# Patient Record
Sex: Female | Born: 1990 | Race: Black or African American | Hispanic: No | Marital: Single | Smoking: Current every day smoker
Health system: Southern US, Community
[De-identification: ages and names within clinical notes are randomized; demographics above are authoritative.]

## PROBLEM LIST (undated history)

## (undated) DIAGNOSIS — R002 Palpitations: Secondary | ICD-10-CM

---
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
## 2008-03-17 ENCOUNTER — Emergency Department: Payer: Self-pay | Admitting: Emergency Medicine

## 2008-07-29 ENCOUNTER — Emergency Department: Payer: Self-pay | Admitting: Emergency Medicine

## 2009-03-16 ENCOUNTER — Ambulatory Visit: Payer: Self-pay | Admitting: Family Medicine

## 2009-05-30 ENCOUNTER — Emergency Department: Payer: Self-pay | Admitting: Unknown Physician Specialty

## 2010-05-15 ENCOUNTER — Emergency Department: Payer: Self-pay | Admitting: Emergency Medicine

## 2011-05-23 ENCOUNTER — Emergency Department (HOSPITAL_COMMUNITY)
Admission: EM | Admit: 2011-05-23 | Discharge: 2011-05-23 | Disposition: A | Discharge status: Home / Self Care | Payer: Self-pay | Attending: Emergency Medicine | Admitting: Emergency Medicine

## 2011-05-23 ENCOUNTER — Emergency Department (HOSPITAL_COMMUNITY): Payer: Self-pay

## 2011-05-23 DIAGNOSIS — F17200 Nicotine dependence, unspecified, uncomplicated: Secondary | ICD-10-CM | POA: Insufficient documentation

## 2011-05-23 DIAGNOSIS — R0789 Other chest pain: Secondary | ICD-10-CM | POA: Insufficient documentation

## 2011-05-23 DIAGNOSIS — M940 Chondrocostal junction syndrome [Tietze]: Secondary | ICD-10-CM | POA: Insufficient documentation

## 2011-05-23 DIAGNOSIS — R55 Syncope and collapse: Secondary | ICD-10-CM | POA: Insufficient documentation

## 2011-05-23 DIAGNOSIS — E538 Deficiency of other specified B group vitamins: Secondary | ICD-10-CM | POA: Insufficient documentation

## 2011-05-23 LAB — BASIC METABOLIC PANEL
BUN: 7 mg/dL (ref 6–23)
Chloride: 104 mEq/L (ref 96–112)
Creatinine, Ser: 0.6 mg/dL (ref 0.50–1.10)
GFR calc Af Amer: >60 mL/min (ref >60)

## 2011-05-23 LAB — POCT PREGNANCY, URINE: Preg Test, Ur: NEGATIVE

## 2011-05-23 LAB — CBC
MCH: 26.4 pg (ref 26.0–34.0)
Platelets: 265 10*3/uL (ref 150–400)
RBC: 4.81 MIL/uL (ref 3.87–5.11)
WBC: 5.8 10*3/uL (ref 4.0–10.5)

## 2011-05-23 LAB — URINE MICROSCOPIC-ADD ON

## 2011-05-23 LAB — POCT I-STAT TROPONIN I: Troponin i, poc: 0 ng/mL (ref 0.00–0.08)

## 2011-05-23 LAB — URINALYSIS, ROUTINE W REFLEX MICROSCOPIC
Ketones, ur: NEGATIVE mg/dL
Nitrite: NEGATIVE
Specific Gravity, Urine: 1.027 (ref 1.005–1.030)
pH: 6.5 (ref 5.0–8.0)

## 2011-05-23 LAB — DIFFERENTIAL
Basophils Absolute: 0 10*3/uL (ref 0.0–0.1)
Basophils Relative: 1 % (ref 0–1)
Eosinophils Absolute: 0.1 10*3/uL (ref 0.0–0.7)
Neutrophils Relative %: 64 % (ref 43–77)

## 2012-02-26 ENCOUNTER — Ambulatory Visit: Payer: Self-pay | Admitting: Family Medicine

## 2014-02-08 ENCOUNTER — Encounter (HOSPITAL_COMMUNITY): Payer: Self-pay | Admitting: Emergency Medicine

## 2014-02-08 ENCOUNTER — Emergency Department (INDEPENDENT_AMBULATORY_CARE_PROVIDER_SITE_OTHER): Payer: No Typology Code available for payment source

## 2014-02-08 ENCOUNTER — Emergency Department (HOSPITAL_COMMUNITY)
Admission: EM | Admit: 2014-02-08 | Discharge: 2014-02-08 | Disposition: A | Discharge status: Home / Self Care | Payer: No Typology Code available for payment source | Source: Home / Self Care

## 2014-02-08 DIAGNOSIS — K219 Gastro-esophageal reflux disease without esophagitis: Principal | ICD-10-CM

## 2014-02-08 DIAGNOSIS — IMO0001 Reserved for inherently not codable concepts without codable children: Secondary | ICD-10-CM

## 2014-02-08 HISTORY — DX: Palpitations: R00.2

## 2014-02-08 MED ORDER — PANTOPRAZOLE SODIUM 40 MG PO TBEC: 40.0000 mg | DELAYED_RELEASE_TABLET | Freq: Every day | ORAL | Status: AC

## 2014-02-08 NOTE — Discharge Instructions (Signed)
Your symptoms are likely realated to heart burn. This will improve with treatment with an acid reducer pill There is no evidence of this being related to your heart or lungs.  Please avoid triggers, and stressfull situations, caffeine, and chocolate.  Please come back if your symptoms get worse.

## 2014-02-08 NOTE — ED Notes (Signed)
Chest pain woke her up last night. Pain from epigastric area to throat.  Pain is constant.  C/o SOB at the same time.  States her R shoulder is numb.  No known injury.  No cold symptoms or allergy symptoms.  Has a little nausea and sweating.  No fever.

## 2014-02-08 NOTE — ED Provider Notes (Signed)
CSN: [id number]     Arrival date & time 02/08/14  1940 History   None    Chief Complaint  Patient presents with  . Chest Pain   (Consider location/radiation/quality/duration/timing/severity/associated sxs/prior Treatment) HPI  CP: started in the middle of the night. Woke pt up from sleep. Sharp in nature. Started in epigastric region and traveled up to neck. Sitting up improves pain. Worse w/ bending over and lying down flat. Pt only able to go a couple steps before coming SOB. Currently experiencing palpitations. No recent strenuous activity.     Past Medical History  Diagnosis Date  . Fluttering sensation of heart    History reviewed. No pertinent past surgical history. History reviewed. No pertinent family history. History  Substance Use Topics  . Smoking status: Current Every Day Smoker -- 0.25 packs/day    Types: Cigarettes  . Smokeless tobacco: Not on file  . Alcohol Use: Yes     Comment: occasional   OB History   Grav Para Term Preterm Abortions TAB SAB Ect Mult Living                 Review of Systems  Constitutional: Negative for fever.  Cardiovascular: Positive for chest pain.  All other systems reviewed and are negative.   Allergies  Review of patient's allergies indicates no known allergies.  Home Medications   Prior to Admission medications   Not on File   BP 123/79  Pulse 99  Temp(Src) 99.5 F (37.5 C) (Oral)  Resp 24  SpO2 100%  LMP 01/30/2014 Physical Exam  Constitutional: She is oriented to person, place, and time. She appears well-developed and well-nourished. No distress.  HENT:  Head: Normocephalic and atraumatic.  Eyes: EOM are normal. Pupils are equal, round, and reactive to light.  Neck: Normal range of motion. No thyromegaly present.  Cardiovascular: Normal rate, regular rhythm, normal heart sounds and intact distal pulses.  Exam reveals no gallop and no friction rub.   No murmur heard. Pulmonary/Chest: Effort normal and breath  sounds normal. No respiratory distress. She has no wheezes. She has no rales. She exhibits no tenderness.  Abdominal: Soft. Bowel sounds are normal.  Musculoskeletal: Normal range of motion.  Mild reproducible chest pain on palpation  Neurological: She is alert and oriented to person, place, and time.  Skin: Skin is warm and dry. She is not diaphoretic.  Psychiatric: She has a normal mood and affect. Her behavior is normal. Judgment and thought content normal.    ED Course  Procedures (including critical care time) Labs Review Labs Reviewed - No data to display  Imaging Review Dg Chest 2 View  02/08/2014   CLINICAL DATA:  Chest pain and shortness of breath  EXAM: CHEST  2 VIEW  COMPARISON:  May 23, 2011  FINDINGS: The heart size and mediastinal contours are within normal limits. Both lungs are clear. The visualized skeletal structures are unremarkable.  IMPRESSION: No active cardiopulmonary disease.   Electronically Signed   By: Sherian ReinWei-Chen  Lin M.D.   On: 02/08/2014 20:43     MDM   1. Reflux    22yo w/ CP likely secondary to reflux and possibly also costochondritis. No evidence that this is cardiac or pulmonary in nature. Also likely w/ a strong anxiety component.  - Protonix - tylenol - precautions givena nd all questions answered    Ozella Rocksavid J Merrell, MD 02/08/14 2120

## 2014-02-09 NOTE — ED Provider Notes (Signed)
Medical screening examination/treatment/procedure(s) were performed by a resident physician or non-physician practitioner and as the supervising physician I was immediately available for consultation/collaboration.  Winnell Bento, MD    Kahmya Pinkham S Ashaad Gaertner, MD 02/09/14 0752 

## 2014-08-13 ENCOUNTER — Encounter (HOSPITAL_COMMUNITY): Payer: Self-pay | Admitting: Emergency Medicine

## 2014-08-13 ENCOUNTER — Emergency Department (HOSPITAL_COMMUNITY)
Admission: EM | Admit: 2014-08-13 | Discharge: 2014-08-13 | Disposition: A | Discharge status: Home / Self Care | Payer: No Typology Code available for payment source | Attending: Emergency Medicine | Admitting: Emergency Medicine

## 2014-08-13 ENCOUNTER — Emergency Department (HOSPITAL_COMMUNITY): Payer: No Typology Code available for payment source

## 2014-08-13 DIAGNOSIS — X58XXXA Exposure to other specified factors, initial encounter: Secondary | ICD-10-CM | POA: Insufficient documentation

## 2014-08-13 DIAGNOSIS — Z720 Tobacco use: Secondary | ICD-10-CM | POA: Insufficient documentation

## 2014-08-13 DIAGNOSIS — Z79899 Other long term (current) drug therapy: Secondary | ICD-10-CM | POA: Insufficient documentation

## 2014-08-13 DIAGNOSIS — S46912A Strain of unspecified muscle, fascia and tendon at shoulder and upper arm level, left arm, initial encounter: Secondary | ICD-10-CM | POA: Insufficient documentation

## 2014-08-13 DIAGNOSIS — Y9289 Other specified places as the place of occurrence of the external cause: Secondary | ICD-10-CM | POA: Insufficient documentation

## 2014-08-13 DIAGNOSIS — Y998 Other external cause status: Secondary | ICD-10-CM | POA: Insufficient documentation

## 2014-08-13 DIAGNOSIS — Y9389 Activity, other specified: Secondary | ICD-10-CM | POA: Insufficient documentation

## 2014-08-13 MED ORDER — METHOCARBAMOL 500 MG PO TABS: 500.0000 mg | ORAL_TABLET | Freq: Two times a day (BID) | ORAL | Status: AC

## 2014-08-13 MED ORDER — MELOXICAM 7.5 MG PO TABS: 15.0000 mg | ORAL_TABLET | Freq: Every day | ORAL | Status: AC

## 2014-08-13 MED ORDER — METHOCARBAMOL 500 MG PO TABS
500.0000 mg | ORAL_TABLET | Freq: Once | ORAL | Status: AC
  Administered 2014-08-13: 500 mg via ORAL
  Filled 2014-08-13: qty 1

## 2014-08-13 MED ORDER — OXYCODONE-ACETAMINOPHEN 5-325 MG PO TABS
2.0000 | ORAL_TABLET | Freq: Once | ORAL | Status: AC
  Administered 2014-08-13: 2 via ORAL
  Filled 2014-08-13: qty 2

## 2014-08-13 NOTE — Discharge Instructions (Signed)
Alternate ice and heat to your shoulder 3-4 times per day. Take Mobic and Robaxin as prescribed. Stretch your shoulder daily. Avoid heavy lifting x 1 week. Follow up with a primary care doctor.  Shoulder Sprain A shoulder sprain is the result of damage to the tough, fiber-like tissues (ligaments) that help hold your shoulder in place. The ligaments may be stretched or torn. Besides the main shoulder joint (the ball and socket), there are several smaller joints that connect the bones in this area. A sprain usually involves one of those joints. Most often it is the acromioclavicular (or AC) joint. That is the joint that connects the collarbone (clavicle) and the shoulder blade (scapula) at the top point of the shoulder blade (acromion). A shoulder sprain is a mild form of what is called a shoulder separation. Recovering from a shoulder sprain may take some time. For some, pain lingers for several months. Most people recover without long term problems. CAUSES   A shoulder sprain is usually caused by some kind of trauma. This might be:  Falling on an outstretched arm.  Being hit hard on the shoulder.  Twisting the arm.  Shoulder sprains are more likely to occur in people who:  Play sports.  Have balance or coordination problems. SYMPTOMS   Pain when you move your shoulder.  Limited ability to move the shoulder.  Swelling and tenderness on top of the shoulder.  Redness or warmth in the shoulder.  Bruising.  A change in the shape of the shoulder. DIAGNOSIS  Your healthcare provider may:  Ask about your symptoms.  Ask about recent activity that might have caused those symptoms.  Examine your shoulder. You may be asked to do simple exercises to test movement. The other shoulder will be examined for comparison.  Order some tests that provide a look inside the body. They can show the extent of the injury. The tests could include:  X-rays.  CT (computed tomography) scan.  MRI  (magnetic resonance imaging) scan. RISKS AND COMPLICATIONS  Loss of full shoulder motion.  Ongoing shoulder pain. TREATMENT  How long it takes to recover from a shoulder sprain depends on how severe it was. Treatment options may include:  Rest. You should not use the arm or shoulder until it heals.  Ice. For 2 or 3 days after the injury, put an ice pack on the shoulder up to 4 times a day. It should stay on for 15 to 20 minutes each time. Wrap the ice in a towel so it does not touch your skin.  Over-the-counter medicine to relieve pain.  A sling or brace. This will keep the arm still while the shoulder is healing.  Physical therapy or rehabilitation exercises. These will help you regain strength and motion. Ask your healthcare provider when it is OK to begin these exercises.  Surgery. The need for surgery is rare with a sprained shoulder, but some people may need surgery to keep the joint in place and reduce pain. HOME CARE INSTRUCTIONS   Ask your healthcare provider about what you should and should not do while your shoulder heals.  Make sure you know how to apply ice to the correct area of your shoulder.  Talk with your healthcare provider about which medications should be used for pain and swelling.  If rehabilitation therapy will be needed, ask your healthcare provider to refer you to a therapist. If it is not recommended, then ask about at-home exercises. Find out when exercise should begin. SEEK MEDICAL  CARE IF:  Your pain, swelling, or redness at the joint increases. SEEK IMMEDIATE MEDICAL CARE IF:   You have a fever.  You cannot move your arm or shoulder. Document Released: 01/25/2009 Document Revised: 12/01/2011 Document Reviewed: 01/25/2009 [facility] Patient Information 2015 [place] Center[employer], [place]. This information is not intended to replace advice given to you by your health care provider. Make sure you discuss any questions you have with your health care provider. Muscle  Cramps and Spasms Muscle cramps and spasms occur when a muscle or muscles tighten and you have no control over this tightening (involuntary muscle contraction). They are a common problem and can develop in any muscle. The most common place is in the calf muscles of the leg. Both muscle cramps and muscle spasms are involuntary muscle contractions, but they also have differences:   Muscle cramps are sporadic and painful. They may last a few seconds to a quarter of an hour. Muscle cramps are often more forceful and last longer than muscle spasms.  Muscle spasms may or may not be painful. They may also last just a few seconds or much longer. CAUSES  It is uncommon for cramps or spasms to be due to a serious underlying problem. In many cases, the cause of cramps or spasms is unknown. Some common causes are:   Overexertion.   Overuse from repetitive motions (doing the same thing over and over).   Remaining in a certain position for a long period of time.   Improper preparation, form, or technique while performing a sport or activity.   Dehydration.   Injury.   Side effects of some medicines.   Abnormally low levels of the salts and ions in your blood (electrolytes), especially potassium and calcium. This could happen if you are taking water pills (diuretics) or you are pregnant.  Some underlying medical problems can make it more likely to develop cramps or spasms. These include, but are not limited to:   Diabetes.   Parkinson disease.   Hormone disorders, such as thyroid problems.   Alcohol abuse.   Diseases specific to muscles, joints, and bones.   Blood vessel disease where not enough blood is getting to the muscles.  HOME CARE INSTRUCTIONS   Stay well hydrated. Drink enough water and fluids to keep your urine clear or pale yellow.  It may be helpful to massage, stretch, and relax the affected muscle.  For tight or tense muscles, use a warm towel, heating pad, or  hot shower water directed to the affected area.  If you are sore or have pain after a cramp or spasm, applying ice to the affected area may relieve discomfort.  Put ice in a plastic bag.  Place a towel between your skin and the bag.  Leave the ice on for 15-20 minutes, 03-04 times a day.  Medicines used to treat a known cause of cramps or spasms may help reduce their frequency or severity. Only take over-the-counter or prescription medicines as directed by your caregiver. SEEK MEDICAL CARE IF:  Your cramps or spasms get more severe, more frequent, or do not improve over time.  MAKE SURE YOU:   Understand these instructions.  Will watch your condition.  Will get help right away if you are not doing well or get worse. Document Released: 02/28/2002 Document Revised: 01/03/2013 Document Reviewed: 08/25/2012 [facility] Patient Information 2015 [place] Center[place]Care, [place]. This information is not intended to replace advice given to you by your health care provider. Make sure you discuss any questions you  have with your health care provider. ° °

## 2014-08-13 NOTE — ED Notes (Signed)
Pt c/o L shoulder pain since yesterday after loading heavy bags onto a truck. Pt sts she can move her arm but it's painful. Pt sts pain goes up into her neck and back.

## 2014-08-13 NOTE — ED Provider Notes (Signed)
CSN: [id number]     Arrival date & time 08/13/14  2029 History  This chart was scribed for non-physician practitioner, Antony MaduraKelly Tannor Pyon, PA-C working with Enid SkeensJoshua M Zavitz, MD by Greggory StallionKayla Andersen, ED scribe. This patient was seen in room WTR8/WTR8 and the patient's care was started at 9:38 PM.   Chief Complaint  Patient presents with  . Shoulder Injury   The history is provided by the patient. No language interpreter was used.    HPI Comments: Alexis Harvey is a 23 y.o. female who presents to the Emergency Department complaining of throbbing, aching left shoulder pain that started 2 days ago after loading heavy bags onto a truck. States she heard a pop in her shoulder when lifting the bags. Reports associated swelling around her shoulder and states pain radiates into her neck and upper back. Movements worsen the pain. She has taken ibuprofen with no relief. Denies numbness.   Past Medical History  Diagnosis Date  . Fluttering sensation of heart    History reviewed. No pertinent past surgical history. No family history on file. History  Substance Use Topics  . Smoking status: Current Every Day Smoker -- 0.25 packs/day    Types: Cigarettes  . Smokeless tobacco: Not on file  . Alcohol Use: Yes     Comment: occasional   OB History    No data available     Review of Systems  Musculoskeletal: Positive for myalgias, joint swelling and arthralgias.  Neurological: Negative for numbness.  All other systems reviewed and are negative.  Allergies  Review of patient's allergies indicates no known allergies.  Home Medications   Prior to Admission medications   Medication Sig Start Date End Date Taking? Authorizing Provider  meloxicam (MOBIC) 7.5 MG tablet Take 2 tablets (15 mg total) by mouth daily. 08/13/14   Antony MaduraKelly Sherilynn Dieu, PA-C  methocarbamol (ROBAXIN) 500 MG tablet Take 1 tablet (500 mg total) by mouth 2 (two) times daily. 08/13/14   Antony MaduraKelly Rayni Nemitz, PA-C  pantoprazole (PROTONIX) 40 MG tablet  Take 1 tablet (40 mg total) by mouth daily. 02/08/14   Ozella Rocksavid J Merrell, MD   BP 116/73 mmHg  Pulse 75  Temp(Src) 98.1 F (36.7 C) (Oral)  Resp 16  SpO2 100%  LMP 08/06/2014   Physical Exam  Constitutional: She is oriented to person, place, and time. She appears well-developed and well-nourished. No distress.  Nontoxic/nonseptic appearing  HENT:  Head: Normocephalic and atraumatic.  Eyes: Conjunctivae and EOM are normal. No scleral icterus.  Neck: Normal range of motion.  Cardiovascular: Normal rate, regular rhythm and intact distal pulses.   Distal radial pulse 2+ in LUE  Pulmonary/Chest: Effort normal. No respiratory distress.  Musculoskeletal: Normal range of motion. She exhibits tenderness.       Left shoulder: She exhibits tenderness and spasm. She exhibits normal range of motion, no bony tenderness, no effusion, no crepitus, no deformity, normal pulse and normal strength.  TTP to posterior L shoulder with appreciable spasm. Normal PROM of L shoulder joint. No erythema, effusion, crepitus, or deformity.  Neurological: She is alert and oriented to person, place, and time. She exhibits normal muscle tone. Coordination normal.  Sensation to light touch intact. Normal grip strength.  Skin: Skin is warm and dry. No rash noted. She is not diaphoretic. No erythema. No pallor.  Psychiatric: She has a normal mood and affect. Her behavior is normal.  Nursing note and vitals reviewed.   ED Course  Procedures (including critical care time)  DIAGNOSTIC STUDIES:  Oxygen Saturation is 100% on RA, normal by my interpretation.    COORDINATION OF CARE: 9:41 PM-Advised pt of xray results. Discussed treatment plan which includes a muscle relaxer and an anti-inflammatory with pt at bedside and pt agreed to plan.   Labs Review Labs Reviewed - No data to display  Imaging Review Dg Shoulder Left  08/13/2014   CLINICAL DATA:  Shoulder injury after for loading heavy bags. Initial encounter   EXAM: LEFT SHOULDER - 2+ VIEW  COMPARISON:  None.  FINDINGS: There is no evidence of fracture or dislocation. There is no evidence of arthropathy or other focal bone abnormality. Soft tissues are unremarkable.  IMPRESSION: Negative.   Electronically Signed   By: Tiburcio PeaJonathan  Watts M.D.   On: 08/13/2014 21:28     EKG Interpretation None      MDM   Final diagnoses:  Shoulder strain, left, initial encounter    23 year old female presents to the emergency department for left shoulder pain. She is neurovascularly intact and has no evidence of fracture or deformity on imaging. Patient does have an appreciable spasm at point of pain. Suspect that muscle injury/spasm is responsible for patient's symptoms today. Will manage symptoms as outpatient with Mobic and Robaxin. Have advised to refrain from strenuous activity or heavy lifting. Return precautions discussed and provided. Patient agreeable to plan with no unaddressed concerns.  I personally performed the services described in this documentation, which was scribed in my presence. The recorded information has been reviewed and is accurate.   Filed Vitals:   08/13/14 2049  BP: 116/73  Pulse: 75  Temp: 98.1 F (36.7 C)  TempSrc: Oral  Resp: 16  SpO2: 100%     Antony MaduraKelly Sid Greener, PA-C 08/14/14 0019  Enid SkeensJoshua M Zavitz, MD 08/16/14 [phone]

## 2015-01-26 ENCOUNTER — Other Ambulatory Visit: Payer: Self-pay | Admitting: Family Medicine

## 2015-01-26 ENCOUNTER — Ambulatory Visit
Admission: RE | Admit: 2015-01-26 | Discharge: 2015-01-26 | Disposition: A | Discharge status: Home / Self Care | Payer: Self-pay | Source: Ambulatory Visit | Attending: Family Medicine | Admitting: Family Medicine

## 2015-01-26 ENCOUNTER — Ambulatory Visit
Admission: RE | Admit: 2015-01-26 | Discharge: 2015-01-26 | Disposition: A | Discharge status: Home / Self Care | Payer: No Typology Code available for payment source | Source: Ambulatory Visit | Attending: Family Medicine | Admitting: Family Medicine

## 2015-01-26 DIAGNOSIS — M25512 Pain in left shoulder: Secondary | ICD-10-CM | POA: Insufficient documentation

## 2015-03-30 ENCOUNTER — Other Ambulatory Visit: Payer: Self-pay | Admitting: Family Medicine

## 2015-03-30 ENCOUNTER — Ambulatory Visit
Admission: RE | Admit: 2015-03-30 | Discharge: 2015-03-30 | Disposition: A | Discharge status: Home / Self Care | Source: Ambulatory Visit | Attending: Family Medicine | Admitting: Family Medicine

## 2015-03-30 DIAGNOSIS — S8991XA Unspecified injury of right lower leg, initial encounter: Secondary | ICD-10-CM | POA: Diagnosis present

## 2015-03-30 DIAGNOSIS — M25461 Effusion, right knee: Secondary | ICD-10-CM | POA: Diagnosis not present

## 2015-03-30 DIAGNOSIS — M660 Rupture of popliteal cyst: Secondary | ICD-10-CM | POA: Diagnosis not present

## 2015-03-30 DIAGNOSIS — M25561 Pain in right knee: Secondary | ICD-10-CM

## 2015-03-30 DIAGNOSIS — M948X6 Other specified disorders of cartilage, lower leg: Secondary | ICD-10-CM | POA: Diagnosis not present

## 2015-09-09 ENCOUNTER — Emergency Department
Admission: EM | Admit: 2015-09-09 | Discharge: 2015-09-09 | Disposition: A | Discharge status: Home / Self Care | Attending: Student | Admitting: Student

## 2015-09-09 ENCOUNTER — Encounter: Payer: Self-pay | Admitting: *Deleted

## 2015-09-09 DIAGNOSIS — F17210 Nicotine dependence, cigarettes, uncomplicated: Secondary | ICD-10-CM | POA: Insufficient documentation

## 2015-09-09 DIAGNOSIS — Z79899 Other long term (current) drug therapy: Secondary | ICD-10-CM | POA: Insufficient documentation

## 2015-09-09 DIAGNOSIS — Y9389 Activity, other specified: Secondary | ICD-10-CM | POA: Insufficient documentation

## 2015-09-09 DIAGNOSIS — Z792 Long term (current) use of antibiotics: Secondary | ICD-10-CM | POA: Insufficient documentation

## 2015-09-09 DIAGNOSIS — Y990 Civilian activity done for income or pay: Secondary | ICD-10-CM | POA: Insufficient documentation

## 2015-09-09 DIAGNOSIS — Y9289 Other specified places as the place of occurrence of the external cause: Secondary | ICD-10-CM | POA: Insufficient documentation

## 2015-09-09 DIAGNOSIS — W2209XA Striking against other stationary object, initial encounter: Secondary | ICD-10-CM | POA: Insufficient documentation

## 2015-09-09 DIAGNOSIS — S81811A Laceration without foreign body, right lower leg, initial encounter: Secondary | ICD-10-CM

## 2015-09-09 MED ORDER — IBUPROFEN 800 MG PO TABS: 800.0000 mg | ORAL_TABLET | Freq: Three times a day (TID) | ORAL | Status: AC | PRN

## 2015-09-09 MED ORDER — LIDOCAINE-EPINEPHRINE (PF) 1 %-1:200000 IJ SOLN: 30.0000 mL | Freq: Once | INTRAMUSCULAR | Status: DC

## 2015-09-09 MED ORDER — LIDOCAINE-EPINEPHRINE (PF) 1 %-1:200000 IJ SOLN
INTRAMUSCULAR | Status: AC
  Filled 2015-09-09: qty 30

## 2015-09-09 MED ORDER — BACITRACIN ZINC 500 UNIT/GM EX OINT
1.0000 "application " | TOPICAL_OINTMENT | Freq: Two times a day (BID) | CUTANEOUS | Status: DC
  Administered 2015-09-09: 1 via TOPICAL
  Filled 2015-09-09: qty 0.9

## 2015-09-09 NOTE — ED Notes (Signed)
Pt reports she was working on a car and when she let it down, she ran into the crowbar that was attached to the lug nut. Pt alert & oriented with NAD noted. Some dried blood noted on leg, but laceration is covered by bandage at this time.

## 2015-09-09 NOTE — ED Provider Notes (Signed)
[facility] Emergency Department Provider Note ____________________________________________  Time seen: Approximately 1:04 PM  I have reviewed the triage vital signs and the nursing notes.   HISTORY  Chief Complaint Extremity Laceration   HPI Alexis Harvey is a 24 y.o. female who presents to the emergency department for evaluation of a laceration. She was working on a car and "ran into the crow bar." She states her pants didn't tear, but she has a deep laceration on her right outer leg. Tetanus is up to date. Bleeding well controlled.    Past Medical History  Diagnosis Date  . Fluttering sensation of heart     There are no active problems to display for this patient.   History reviewed. No pertinent past surgical history.  Current Outpatient Rx  Name  Route  Sig  Dispense  Refill  . ibuprofen (ADVIL,MOTRIN) 800 MG tablet   Oral   Take 1 tablet (800 mg total) by mouth every 8 (eight) hours as needed.   30 tablet   0   . meloxicam (MOBIC) 7.5 MG tablet   Oral   Take 2 tablets (15 mg total) by mouth daily.   30 tablet   0   . methocarbamol (ROBAXIN) 500 MG tablet   Oral   Take 1 tablet (500 mg total) by mouth 2 (two) times daily.   20 tablet   0   . pantoprazole (PROTONIX) 40 MG tablet   Oral   Take 1 tablet (40 mg total) by mouth daily.   14 tablet   0     Allergies Review of patient's allergies indicates no known allergies.  No family history on file.  Social History Social History  Substance Use Topics  . Smoking status: Current Every Day Smoker -- 0.25 packs/day    Types: Cigarettes  . Smokeless tobacco: None  . Alcohol Use: Yes     Comment: occasional    Review of Systems   Constitutional: No fever/chills Eyes: No visual changes. ENT: No congestion or rhinorrhea Cardiovascular: Denies chest pain. Respiratory: Denies shortness of breath. Gastrointestinal: No abdominal pain.  No nausea, no vomiting.  No diarrhea.   No constipation. Genitourinary: Negative for dysuria. Musculoskeletal: Negative for back pain. Skin: Positive for laceration on right outer leg. Neurological: Negative for headaches, focal weakness or numbness.  10-point ROS otherwise negative.  ____________________________________________   PHYSICAL EXAM:  VITAL SIGNS: ED Triage Vitals  Enc Vitals Group     BP 09/09/15 1202 136/93 mmHg     Pulse Rate 09/09/15 1202 92     Resp 09/09/15 1202 18     Temp 09/09/15 1202 98.5 F (36.9 C)     Temp Source 09/09/15 1202 Oral     SpO2 09/09/15 1202 98 %     Weight 09/09/15 1202 189 lb (85.73 kg)     Height 09/09/15 1202 5\' 7"  (1.702 m)     Head Cir --      Peak Flow --      Pain Score 09/09/15 1204 3     Pain Loc --      Pain Edu? --      Excl. in GC? --     Constitutional: Alert and oriented. Well appearing and in no acute distress. Eyes: Conjunctivae are normal. PERRL. EOMI. Head: Atraumatic. Nose: No congestion/rhinnorhea. Mouth/Throat: Mucous membranes are moist.  Oropharynx non-erythematous. No oral lesions. Neck: No stridor. Cardiovascular: Normal rate, regular rhythm.  Good peripheral circulation. Respiratory: Normal respiratory effort.  No retractions. Lungs CTAB. Gastrointestinal: Soft and nontender. No distention. No abdominal bruits.  Musculoskeletal: No lower extremity tenderness nor edema.  No joint effusions. Neurologic:  Normal speech and language. No gross focal neurologic deficits are appreciated. Speech is normal. No gait instability. Skin:  7cm laceration through the subcutaneous tissue of the right lateral extremity; Negative for petechiae.  Psychiatric: Mood and affect are normal. Speech and behavior are normal.  ____________________________________________   LABS (all labs ordered are listed, but only abnormal results are displayed)  Labs Reviewed - No data to  display ____________________________________________  EKG   ____________________________________________  RADIOLOGY  Not indicated. ____________________________________________   PROCEDURES  Procedure(s) performed:   LACERATION REPAIR Performed by: Kem Boroughs Authorized by: Kem Boroughs Consent: Verbal consent obtained. Risks and benefits: risks, benefits and alternatives were discussed Consent given by: patient Patient identity confirmed: provided demographic data Prepped and Draped in normal sterile fashion Wound explored  Laceration Location: Right lateral lower extremity  Laceration Length: 7 cm  No Foreign Bodies seen or palpated  Anesthesia: local infiltration  Local anesthetic: lidocaine 1% with epinephrine  Anesthetic total: 12 ml  Irrigation method: syringe Amount of cleaning: large  Skin closure: 4-0 Vicryl; 4-0 Nylon  Number of sutures: 8 vicryl; 9 nylon  Technique: Simple interrupted  Patient tolerance: Patient tolerated the procedure well with no immediate complications.  ____________________________________________   INITIAL IMPRESSION / ASSESSMENT AND PLAN / ED COURSE  Pertinent labs & imaging results that were available during my care of the patient were reviewed by me and considered in my medical decision making (see chart for details).  Patient was advised to go to urgent care or PCP for suture removal in 12 days. She was advised she could return here for removal if unable to go elsewhere. She was advised to apply Bacitracin 2 times per day and keep the wound clean and dry. She was advised to return to the ER for symptoms of concern if unable to schedule an appointment. ____________________________________________   FINAL CLINICAL IMPRESSION(S) / ED DIAGNOSES  Final diagnoses:  Laceration of lower leg with complication, right, initial encounter       Chinita Pester, FNP 09/09/15 1441  Gayla Doss, MD 09/09/15  [phone]

## 2015-09-09 NOTE — ED Notes (Signed)
Pt discharged home after verbalizing understanding of discharge instructions; nad noted. 

## 2015-09-09 NOTE — Discharge Instructions (Signed)
Laceration Care, Adult  A laceration is a cut that goes through all layers of the skin. The cut also goes into the tissue that is right under the skin. Some cuts heal on their own. Others need to be closed with stitches (sutures), staples, skin adhesive strips, or wound glue. Taking care of your cut lowers your risk of infection and helps your cut to heal better.  HOW TO TAKE CARE OF YOUR CUT  For stitches or staples:  · Keep the wound clean and dry.  · If you were given a bandage (dressing), you should change it at least one time per day or as told by your doctor. You should also change it if it gets wet or dirty.  · Keep the wound completely dry for the first 24 hours or as told by your doctor. After that time, you may take a shower or a bath. However, make sure that the wound is not soaked in water until after the stitches or staples have been removed.  · Clean the wound one time each day or as told by your doctor:    Wash the wound with soap and water.    Rinse the wound with water until all of the soap comes off.    Pat the wound dry with a clean towel. Do not rub the wound.  · After you clean the wound, put a thin layer of antibiotic ointment on it as told by your doctor. This ointment:    Helps to prevent infection.    Keeps the bandage from sticking to the wound.  · Have your stitches or staples removed as told by your doctor.  If your doctor used skin adhesive strips:   · Keep the wound clean and dry.  · If you were given a bandage, you should change it at least one time per day or as told by your doctor. You should also change it if it gets dirty or wet.  · Do not get the skin adhesive strips wet. You can take a shower or a bath, but be careful to keep the wound dry.  · If the wound gets wet, pat it dry with a clean towel. Do not rub the wound.  · Skin adhesive strips fall off on their own. You can trim the strips as the wound heals. Do not remove any strips that are still stuck to the wound. They will  fall off after a while.  If your doctor used wound glue:  · Try to keep your wound dry, but you may briefly wet it in the shower or bath. Do not soak the wound in water, such as by swimming.  · After you take a shower or a bath, gently pat the wound dry with a clean towel. Do not rub the wound.  · Do not do any activities that will make you really sweaty until the skin glue has fallen off on its own.  · Do not apply liquid, cream, or ointment medicine to your wound while the skin glue is still on.  · If you were given a bandage, you should change it at least one time per day or as told by your doctor. You should also change it if it gets dirty or wet.  · If a bandage is placed over the wound, do not let the tape for the bandage touch the skin glue.  · Do not pick at the glue. The skin glue usually stays on for 5-10 days. Then, it   falls off of the skin.  General Instructions   · To help prevent scarring, make sure to cover your wound with sunscreen whenever you are outside after stitches are removed, after adhesive strips are removed, or when wound glue stays in place and the wound is healed. Make sure to wear a sunscreen of at least 30 SPF.  · Take over-the-counter and prescription medicines only as told by your doctor.  · If you were given antibiotic medicine or ointment, take or apply it as told by your doctor. Do not stop using the antibiotic even if your wound is getting better.  · Do not scratch or pick at the wound.  · Keep all follow-up visits as told by your doctor. This is important.  · Check your wound every day for signs of infection. Watch for:    Redness, swelling, or pain.    Fluid, blood, or pus.  · Raise (elevate) the injured area above the level of your heart while you are sitting or lying down, if possible.  GET HELP IF:  · You got a tetanus shot and you have any of these problems at the injection site:    Swelling.    Very bad pain.    Redness.    Bleeding.  · You have a fever.  · A wound that was  closed breaks open.  · You notice a bad smell coming from your wound or your bandage.  · You notice something coming out of the wound, such as wood or glass.  · Medicine does not help your pain.  · You have more redness, swelling, or pain at the site of your wound.  · You have fluid, blood, or pus coming from your wound.  · You notice a change in the color of your skin near your wound.  · You need to change the bandage often because fluid, blood, or pus is coming from the wound.  · You start to have a new rash.  · You start to have numbness around the wound.  GET HELP RIGHT AWAY IF:  · You have very bad swelling around the wound.  · Your pain suddenly gets worse and is very bad.  · You notice painful lumps near the wound or on skin that is anywhere on your body.  · You have a red streak going away from your wound.  · The wound is on your hand or foot and you cannot move a finger or toe like you usually can.  · The wound is on your hand or foot and you notice that your fingers or toes look pale or bluish.     This information is not intended to replace advice given to you by your health care provider. Make sure you discuss any questions you have with your health care provider.     Document Released: 02/25/2008 Document Revised: 01/23/2015 Document Reviewed: 09/04/2014  [employer] Interactive Patient Education ©2016 [employer]

## 2015-09-09 NOTE — ED Notes (Signed)
Patient states she was working on a car and bumped into a crowbar. Patient has a full thickness wound on right outer shin. Patient walked into Emergency.

## 2016-07-15 IMAGING — MR MR KNEE*R* W/O CM
6 series · 36 of 40 positions shown · non-contrast
Comparison: Radiographs 03/17/2015.

CLINICAL DATA: Right lateral knee pain with swelling and limited
range of motion since injury approximately 2 weeks ago. No previous
relevant surgery. Initial encounter.

EXAM:
MRI OF THE RIGHT KNEE WITHOUT CONTRAST
TECHNIQUE: Multiplanar, multisequence MR imaging of the knee was performed. No
intravenous contrast was administered.

[Series 3: PD fat-sat · axial · 3.0mm · 0.50mm/px · z∈[-58,+53]mm · 9 of 35 slices shown (1 of 4)]
[im 1/35]
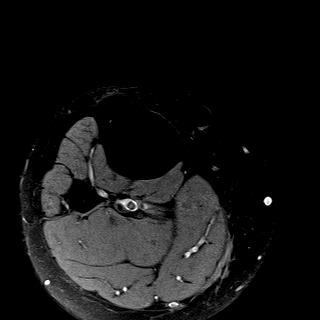
[im 5/35]
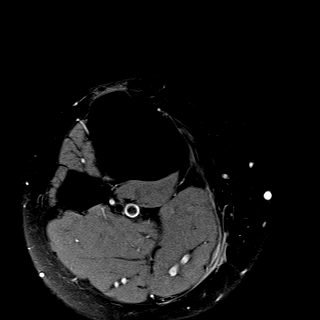
[im 9/35]
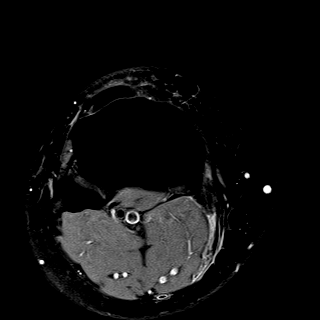
[im 13/35]
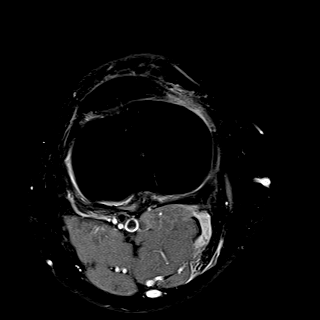
[im 18/35]
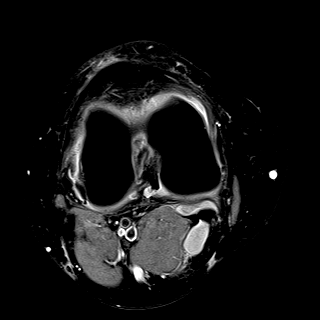
[im 22/35]
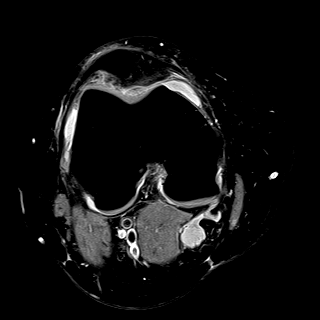
[im 26/35]
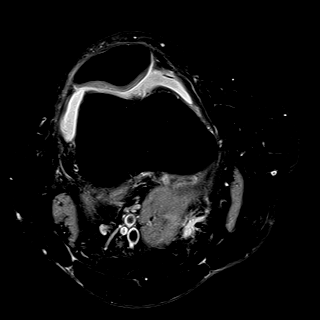
[im 30/35]
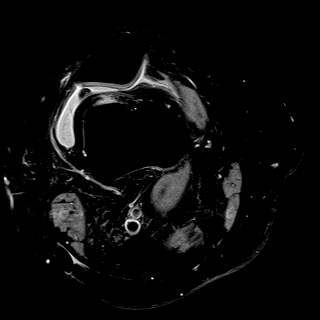
[im 35/35]
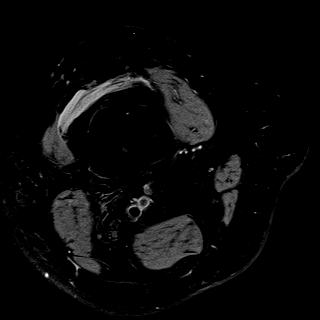

[Series 4: T1 · coronal · 3.0mm · 0.50mm/px · 4 of 33 slices shown]
[im 1/33]
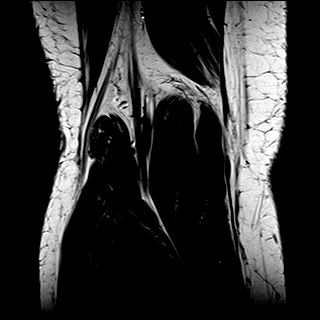
[im 5/33]
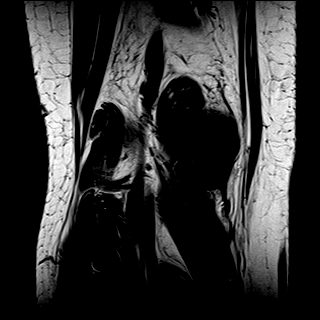
[im 10/33]
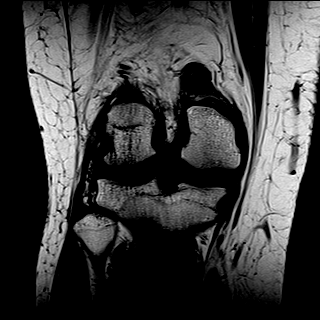
[im 14/33]
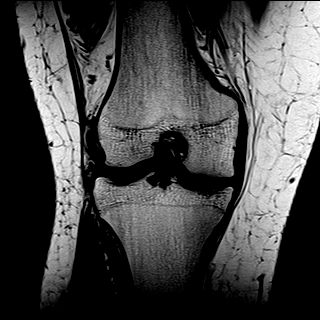

[Series 5: T2 fat-sat · coronal · 3.0mm · 0.31mm/px · 7 of 33 slices shown]
[im 1/33]
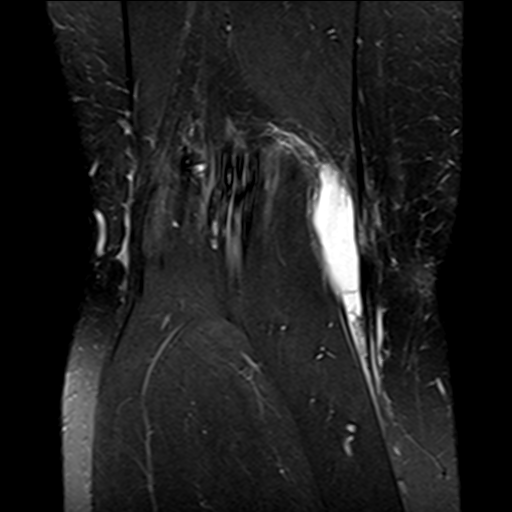
[im 6/33]
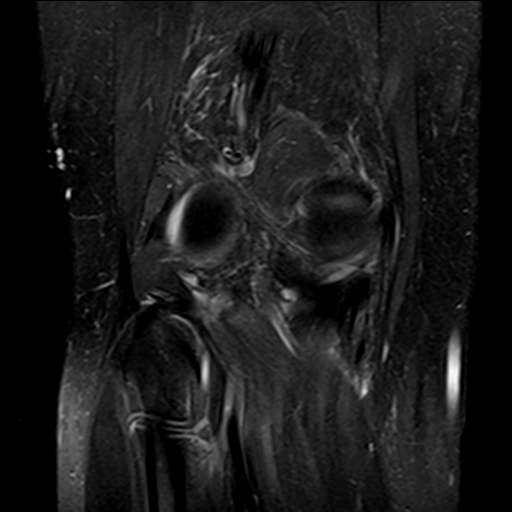
[im 11/33]
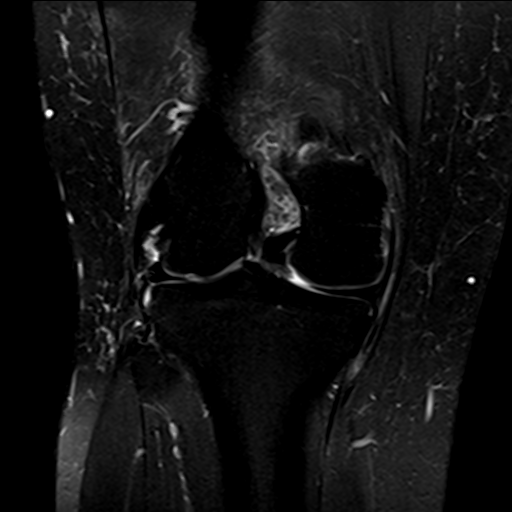
[im 17/33]
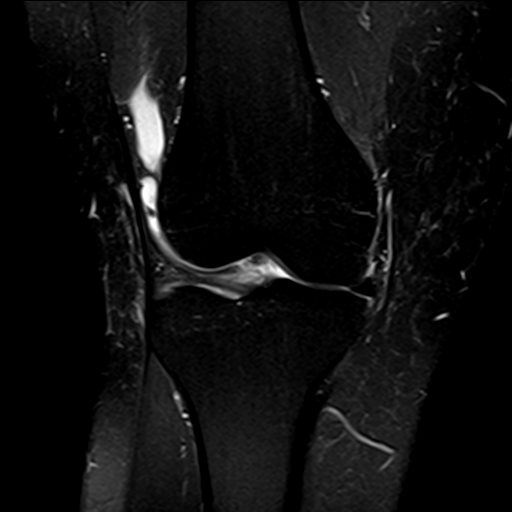
[im 22/33]
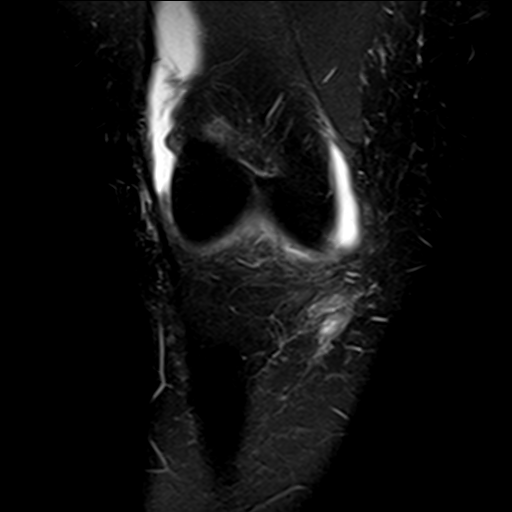
[im 27/33]
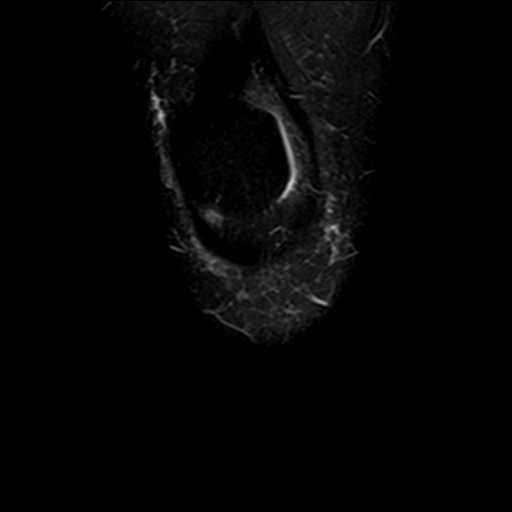
[im 33/33]
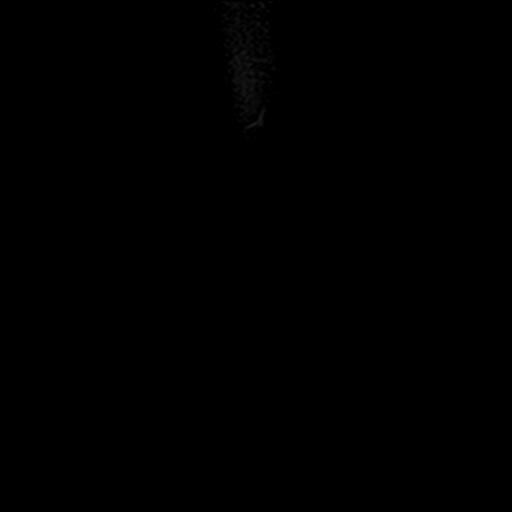

[Series 6: PD fat-sat · coronal · 3.0mm · 0.50mm/px · 7 of 33 slices shown (2 of 4)]
[im 1/33]
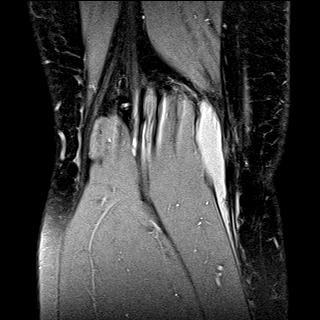
[im 6/33]
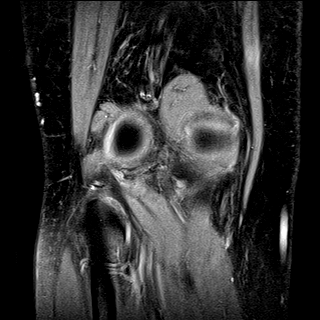
[im 11/33]
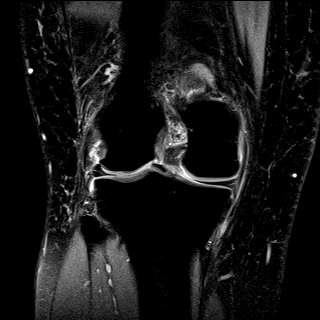
[im 17/33]
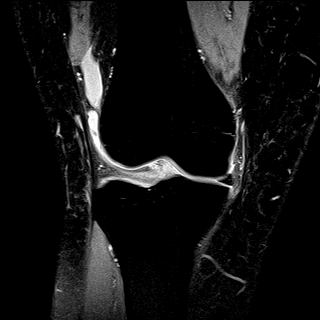
[im 22/33]
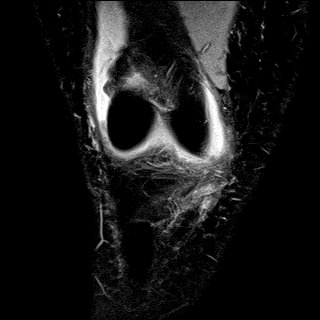
[im 27/33]
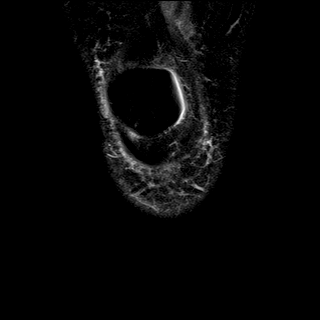
[im 33/33]
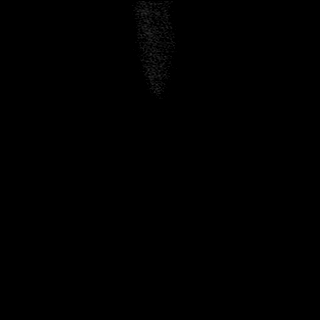

[Series 7: PD fat-sat · sagittal · 3.0mm · 0.50mm/px · 7 of 32 slices shown (3 of 4)]
[im 1/32]
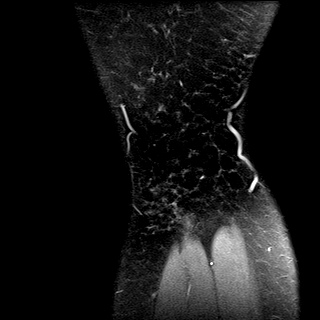
[im 6/32]
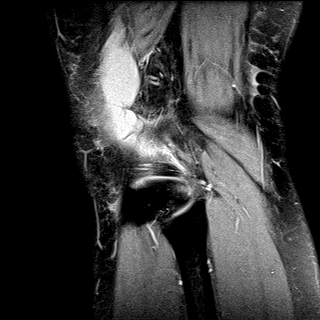
[im 11/32]
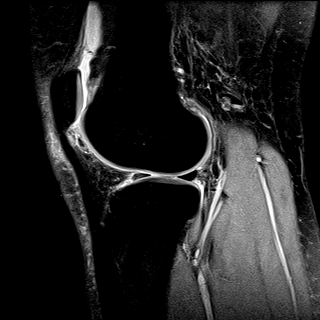
[im 16/32]
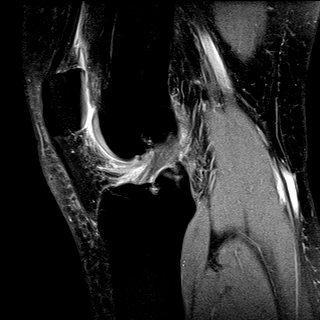
[im 21/32]
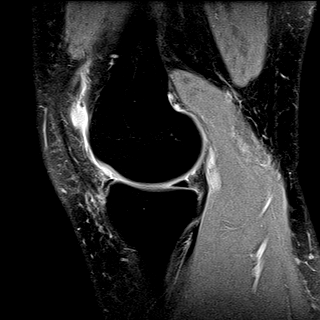
[im 26/32]
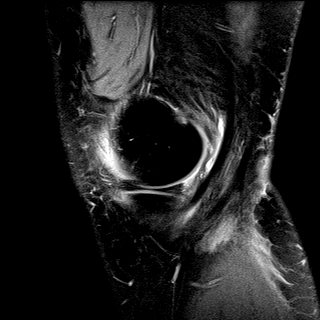
[im 32/32]
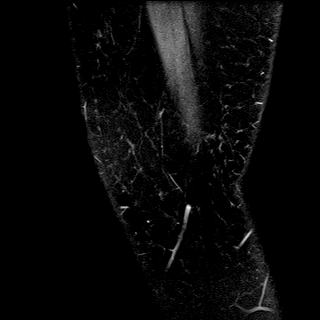

[Series 8: PD fat-sat · oblique · 2.0mm · 0.62mm/px · 2 of 11 slices shown (4 of 4)]
[im 1/11]
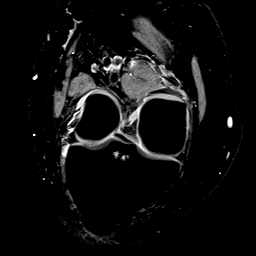
[im 11/11]
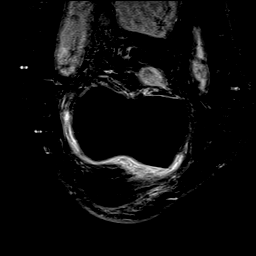

[36 of 40 positions shown; findings below may reference images not displayed]

FINDINGS: MENISCI

Medial meniscus:  Intact with normal morphology.

Lateral meniscus:  Intact with normal morphology.

LIGAMENTS

Cruciates:  Intact.

Collaterals:  Intact.

CARTILAGE

Patellofemoral:  Preserved.

Medial:  Mild chondral thinning without focal defect.

Lateral: Focal nearly full-thickness chondral thinning over the
posterior aspect of the lateral femoral condyle, best seen on the
coronal images. No subchondral signal abnormality.

OTHER

Joint:  Small to moderate joint effusion.  No loose bodies observed.

Popliteal Fossa: Mildly complex, small Baker's cyst may be partially
ruptured into the proximal calf.

Extensor Mechanism: Intact. There is a mild patella alta with mild
edema in the prepatellar subcutaneous and infrapatellar fat. No
signs of transient patellar dislocation injury.

Bones:  No significant extra-articular osseous findings.
IMPRESSION: 1. Small to moderate knee joint effusion with small partially
ruptured Baker's cyst.
2. Mild anterior soft tissue edema.
3. Focal lateral femoral chondral defect/chondromalacia. No acute
osseous findings.
4. Intact menisci, cruciate and collateral ligaments.

## 2017-02-05 ENCOUNTER — Ambulatory Visit
Admission: RE | Admit: 2017-02-05 | Discharge: 2017-02-05 | Disposition: A | Discharge status: Home / Self Care | Payer: Commercial Managed Care - PPO | Source: Ambulatory Visit | Attending: Family Medicine | Admitting: Family Medicine

## 2017-02-05 ENCOUNTER — Other Ambulatory Visit: Payer: Self-pay | Admitting: Family Medicine

## 2017-02-05 DIAGNOSIS — M25512 Pain in left shoulder: Secondary | ICD-10-CM

## 2018-05-24 IMAGING — CR DG SHOULDER 2+V*L*
1 series · 3 of 3 positions shown · non-contrast
Comparison: 01/26/2015

CLINICAL DATA: Left anterior shoulder pain for the past 2 months.
No known injury.

EXAM:
LEFT SHOULDER - 2+ VIEW

[Series 1: dg shoulder left · 0.14mm/px · 3 of 3 slices shown]
[im 1/3]
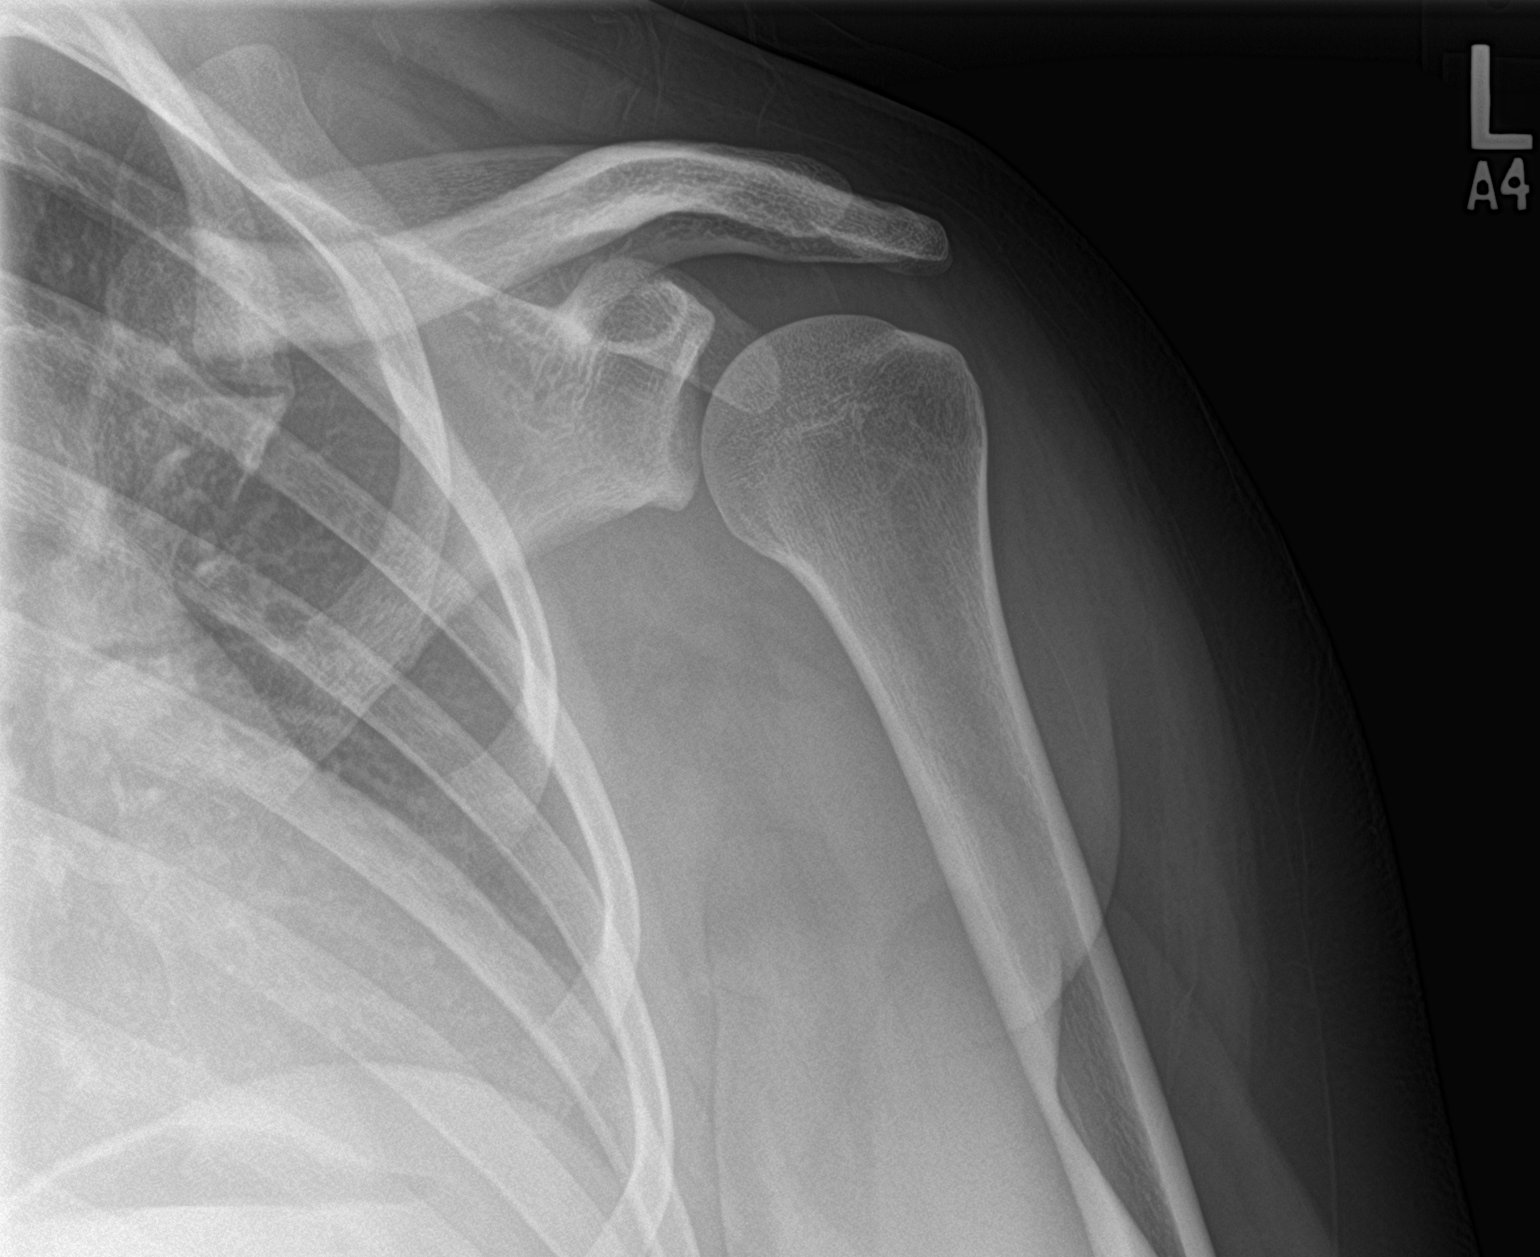
[im 2/3]
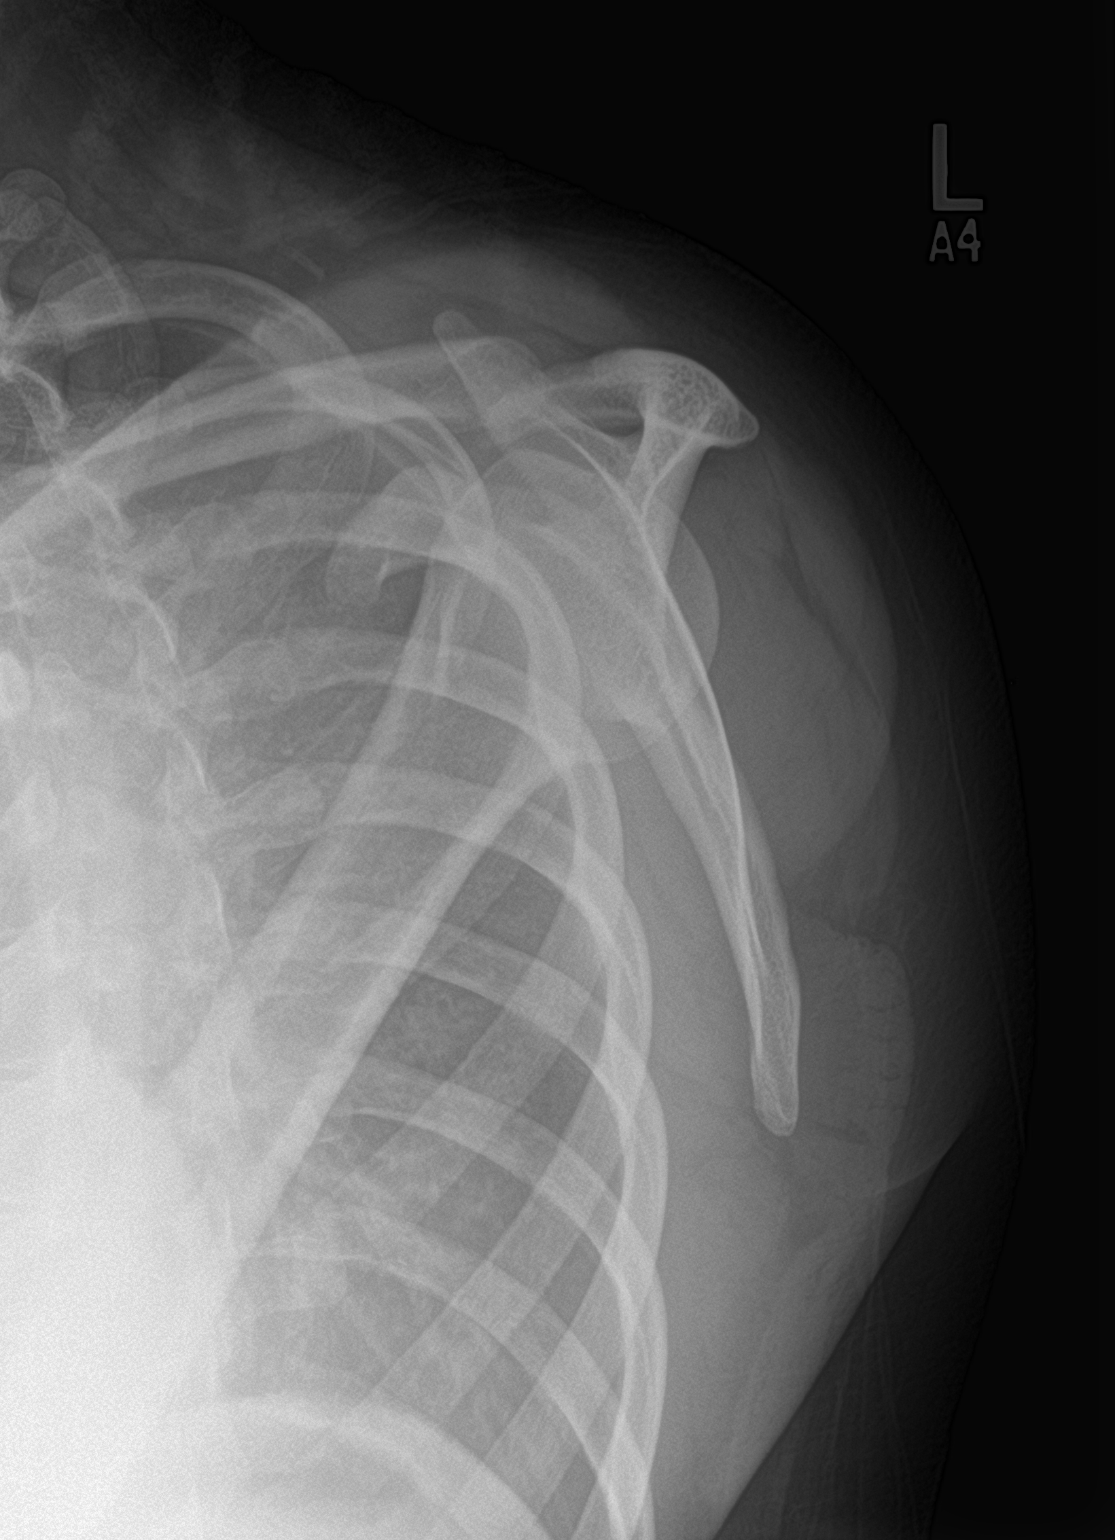
[im 3/3]
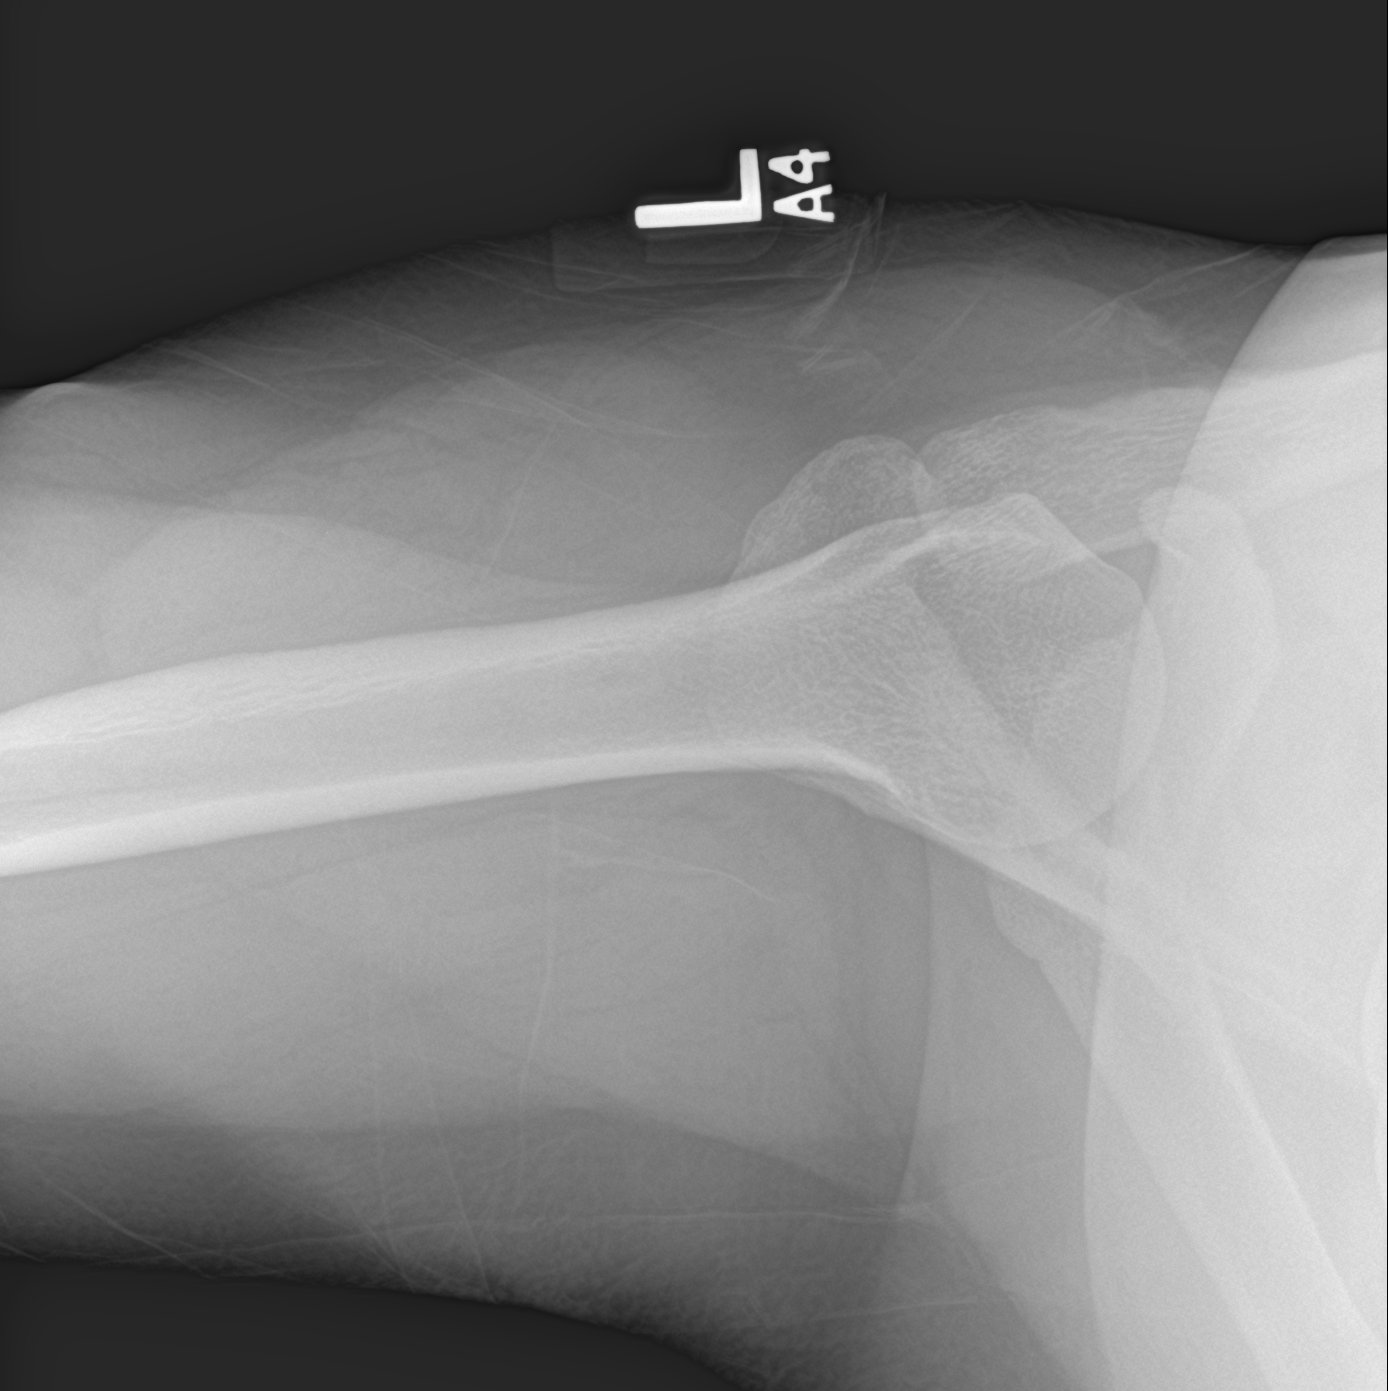

[3 of 3 positions shown; findings below may reference images not displayed]

FINDINGS: No fracture or dislocation. Glenohumeral and acromioclavicular joint
spaces are preserved. No evidence of calcific tendinitis. Regional
soft tissues appear normal. Limited visualization of the adjacent
thorax is normal.
IMPRESSION: No explanation for patient's left anterior shoulder pain.
